# Patient Record
Sex: Male | Born: 1970 | Race: White | Hispanic: No | Marital: Single | Smoking: Never smoker
Health system: Southern US, Community
[De-identification: ages and names within clinical notes are randomized; demographics above are authoritative.]

## PROBLEM LIST (undated history)

## (undated) DIAGNOSIS — E079 Disorder of thyroid, unspecified: Secondary | ICD-10-CM

## (undated) DIAGNOSIS — I10 Essential (primary) hypertension: Secondary | ICD-10-CM

## (undated) HISTORY — PX: ANKLE FRACTURE SURGERY: SHX122

## (undated) HISTORY — PX: THYROID SURGERY: SHX805

---

## 2007-03-13 ENCOUNTER — Ambulatory Visit: Payer: Self-pay | Admitting: Emergency Medicine

## 2007-03-20 ENCOUNTER — Ambulatory Visit: Payer: Self-pay | Admitting: Internal Medicine

## 2007-06-20 ENCOUNTER — Ambulatory Visit: Payer: Self-pay | Admitting: Family Medicine

## 2007-06-23 ENCOUNTER — Ambulatory Visit: Payer: Self-pay | Admitting: Internal Medicine

## 2007-07-20 ENCOUNTER — Ambulatory Visit: Payer: Self-pay | Admitting: Internal Medicine

## 2008-10-22 ENCOUNTER — Ambulatory Visit: Payer: Self-pay | Admitting: Otolaryngology

## 2008-11-20 ENCOUNTER — Ambulatory Visit: Payer: Self-pay | Admitting: Otolaryngology

## 2009-02-20 ENCOUNTER — Ambulatory Visit: Payer: Self-pay | Admitting: Family Medicine

## 2009-12-12 ENCOUNTER — Ambulatory Visit: Payer: Self-pay | Admitting: Family Medicine

## 2011-06-08 IMAGING — CT CT HEAD WITHOUT CONTRAST
1 series · 16 of 30 positions shown, 20 images · non-contrast
Comparison: none

REASON FOR EXAM: blurred vision light headedness
COMMENTS:

PROCEDURE:     CT  - CT HEAD WITHOUT CONTRAST  - December 12, 2009  [DATE]
RESULT:     Comparison:  None
TECHNIQUE: Multiple axial images from the foramen magnum to the vertex were
obtained without IV contrast.

[Series 2: soft tissue · axial · 0.48mm/px · z∈[-170,-34]mm · 16 of 31 slices shown, 20 images]
[im 2/31  brain]
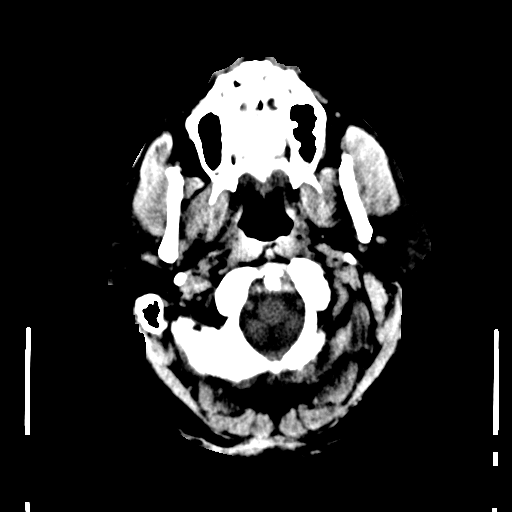
[im 2/31  bone]
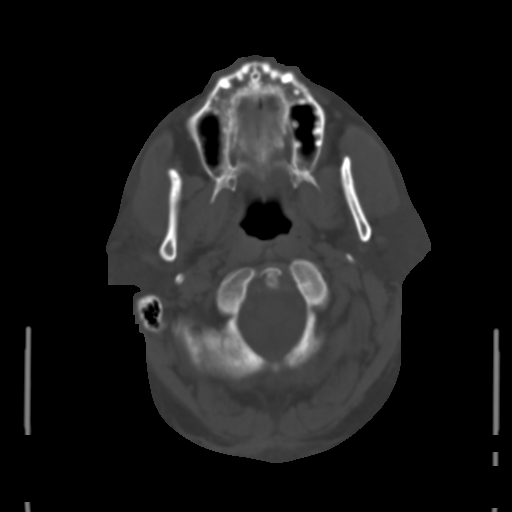
[im 4/31  brain]
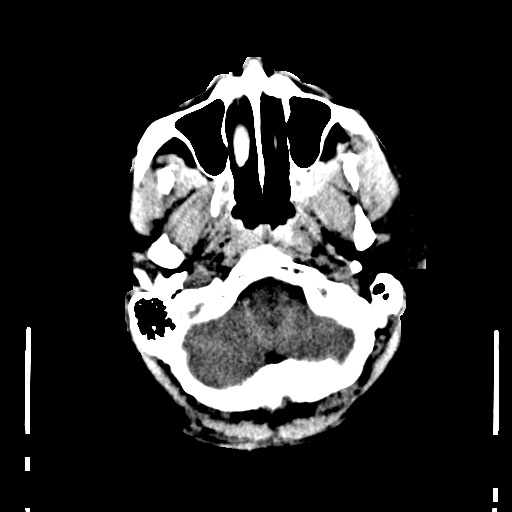
[im 6/31  brain]
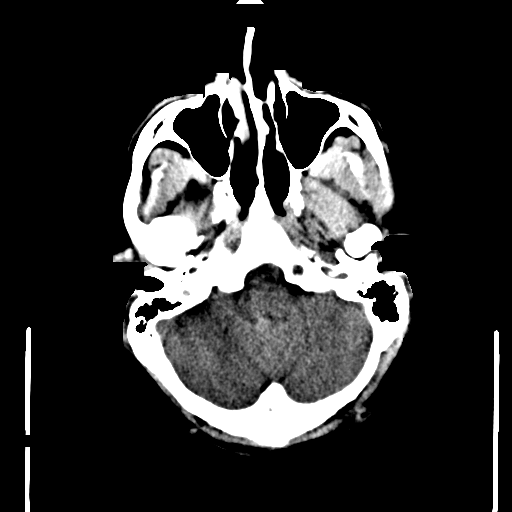
[im 8/31  brain]
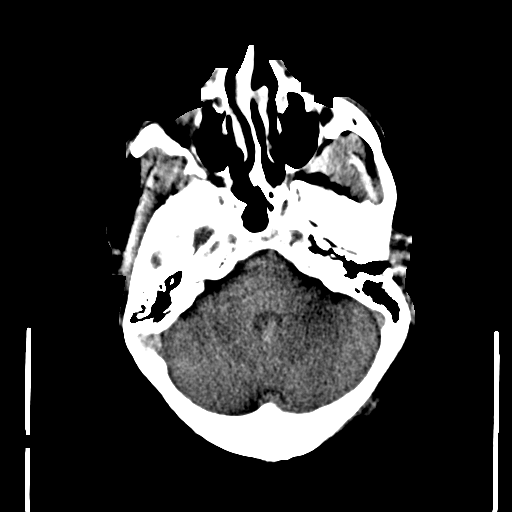
[im 9/31  brain]
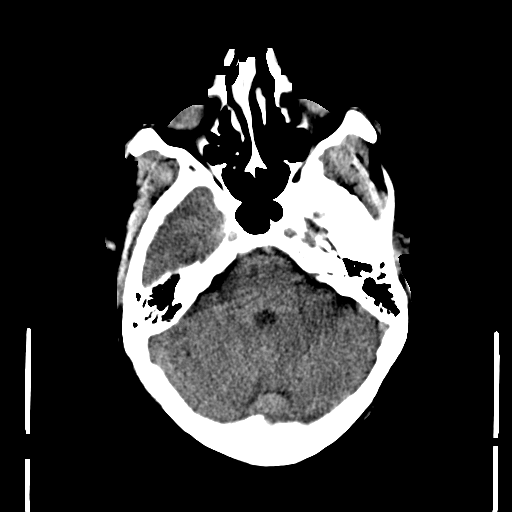
[im 9/31  bone]
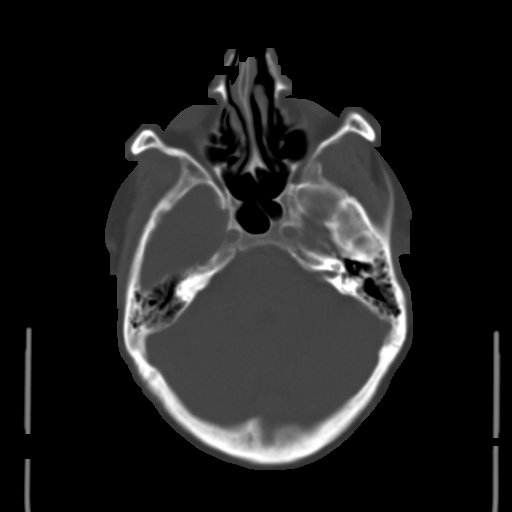
[im 11/31  brain]
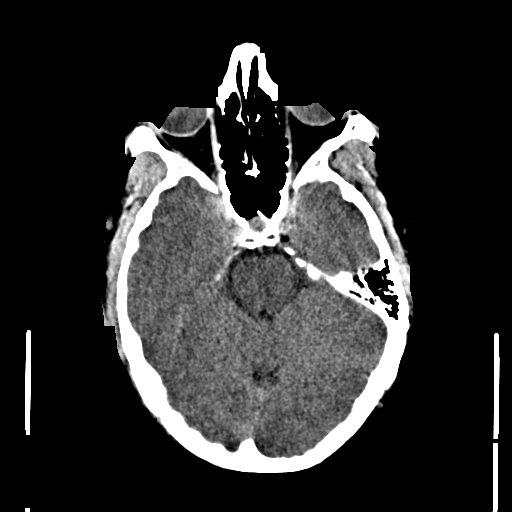
[im 13/31  brain]
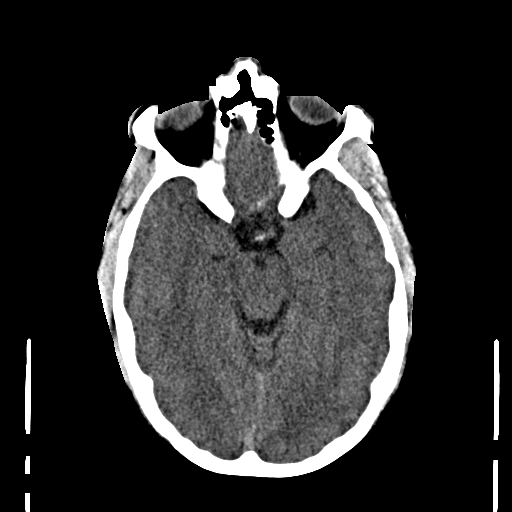
[im 15/31  brain]
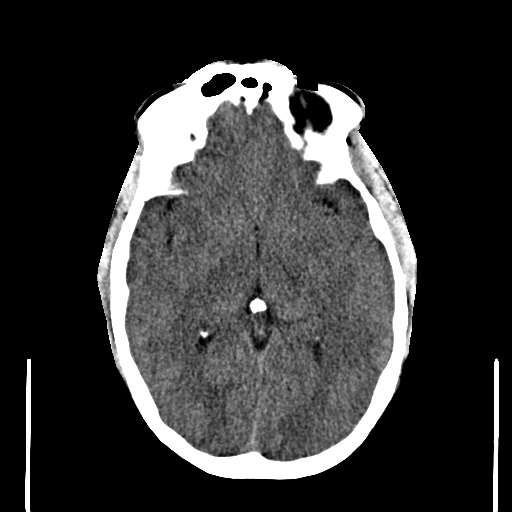
[im 16/31  brain]
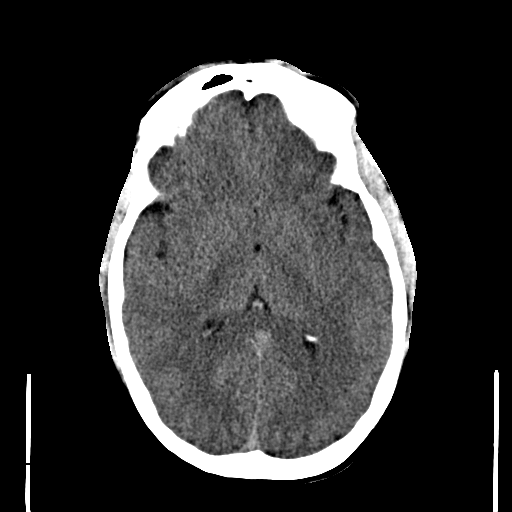
[im 16/31  bone]
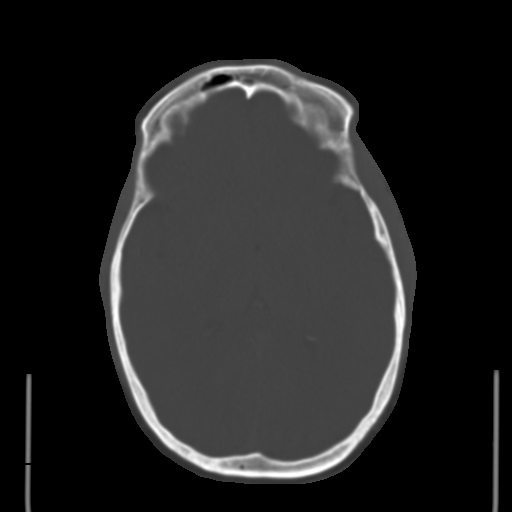
[im 18/31  brain]
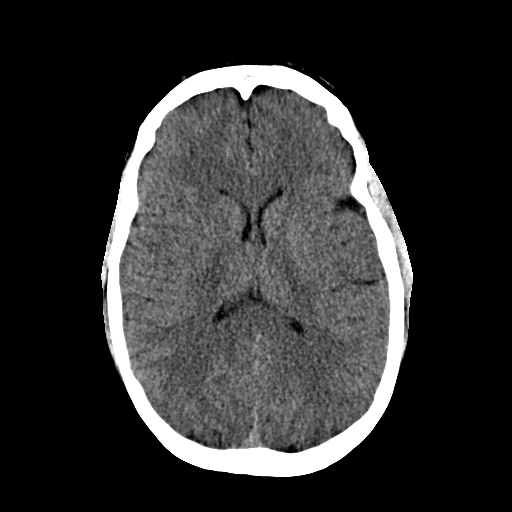
[im 20/31  brain]
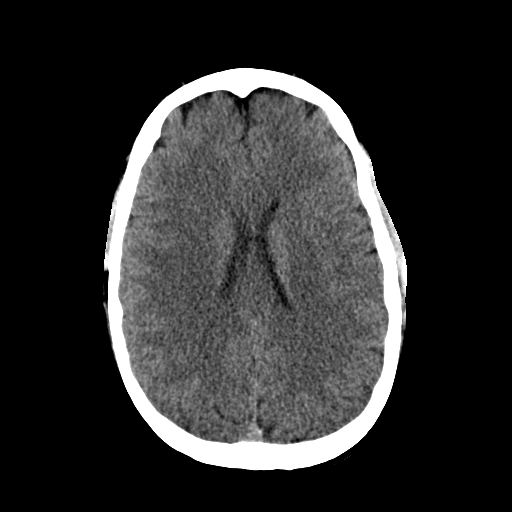
[im 22/31  brain]
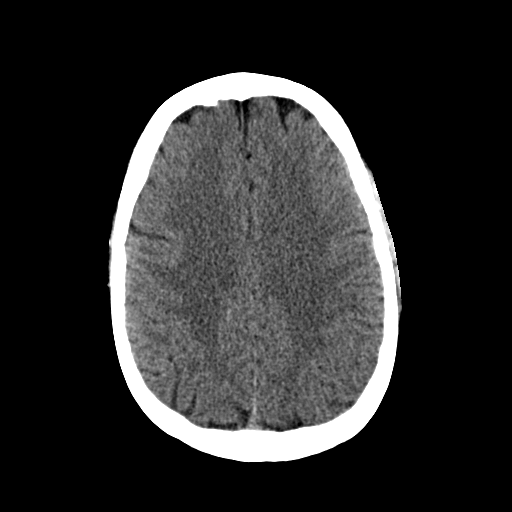
[im 23/31  brain]
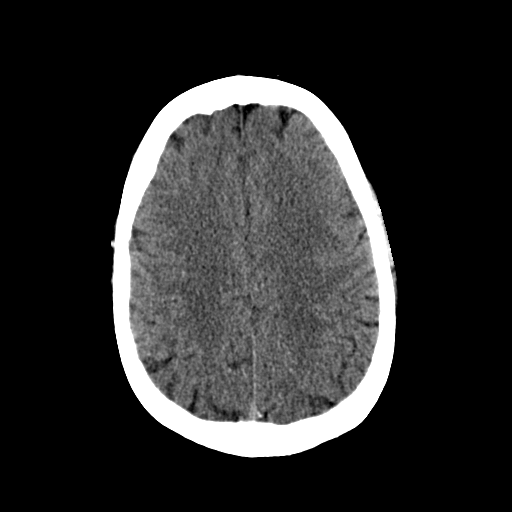
[im 23/31  bone]
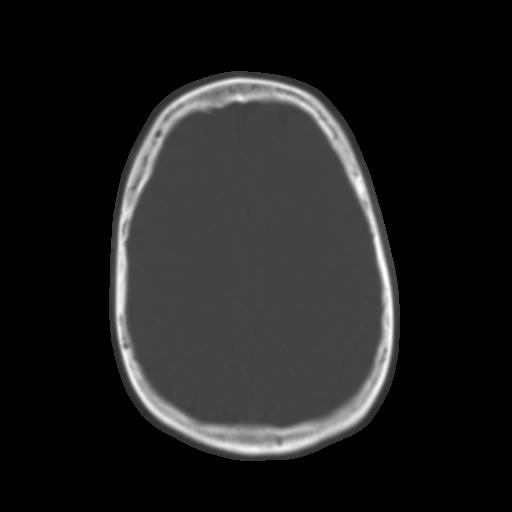
[im 25/31  brain]
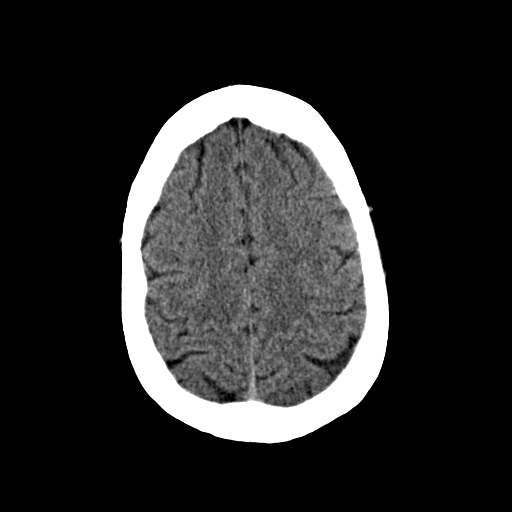
[im 27/31  brain]
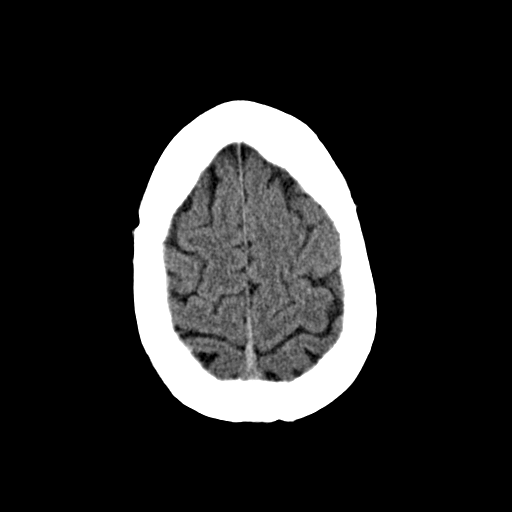
[im 29/31  brain]
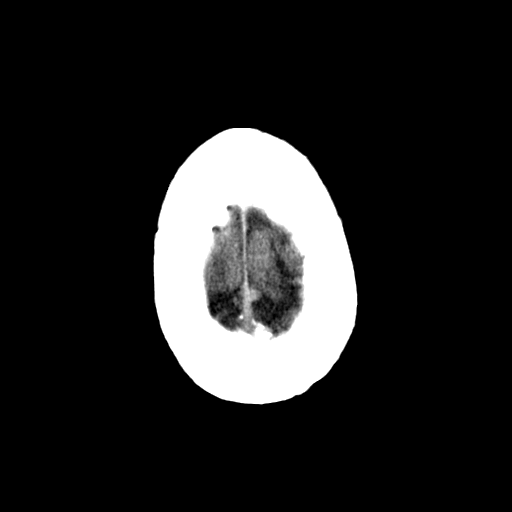

[16 of 30 positions shown; findings below may reference images not displayed]

FINDINGS: There is no evidence of mass effect, midline shift, or extra-axial fluid
collections.  There is no evidence of a space-occupying lesion or
intracranial hemorrhage. There is no evidence of a cortical-based area of
acute infarction.

The ventricles and sulci are appropriate for the patient's age. The basal
cisterns are patent.

Visualized portions of the orbits are unremarkable. The visualized portions
of the paranasal sinuses and mastoid air cells are unremarkable.

The osseous structures are unremarkable.
IMPRESSION: No acute intracranial process.

## 2013-01-26 ENCOUNTER — Ambulatory Visit: Payer: Self-pay | Admitting: Family Medicine

## 2014-04-24 ENCOUNTER — Ambulatory Visit: Payer: Self-pay | Admitting: Internal Medicine

## 2015-11-20 ENCOUNTER — Encounter: Payer: Self-pay | Admitting: *Deleted

## 2015-11-20 ENCOUNTER — Ambulatory Visit
Admission: EM | Admit: 2015-11-20 | Discharge: 2015-11-20 | Disposition: A | Discharge status: Home / Self Care | Payer: Worker's Compensation | Attending: Family Medicine | Admitting: Family Medicine

## 2015-11-20 ENCOUNTER — Ambulatory Visit (INDEPENDENT_AMBULATORY_CARE_PROVIDER_SITE_OTHER): Payer: Worker's Compensation

## 2015-11-20 DIAGNOSIS — S5402XA Injury of ulnar nerve at forearm level, left arm, initial encounter: Secondary | ICD-10-CM

## 2015-11-20 DIAGNOSIS — S46012A Strain of muscle(s) and tendon(s) of the rotator cuff of left shoulder, initial encounter: Secondary | ICD-10-CM | POA: Diagnosis not present

## 2015-11-20 DIAGNOSIS — M7712 Lateral epicondylitis, left elbow: Secondary | ICD-10-CM | POA: Diagnosis not present

## 2015-11-20 HISTORY — DX: Disorder of thyroid, unspecified: E07.9

## 2015-11-20 HISTORY — DX: Essential (primary) hypertension: I10

## 2015-11-20 MED ORDER — NAPROXEN 500 MG PO TABS: 500.0000 mg | ORAL_TABLET | Freq: Two times a day (BID) | ORAL | Status: AC

## 2015-11-20 NOTE — ED Provider Notes (Signed)
CSN: [id number]     Arrival date & time 11/20/15  1455 History   First MD Initiated Contact with Patient 11/20/15 1708     Chief Complaint  Patient presents with  . Arm Injury   (Consider location/radiation/quality/duration/timing/severity/associated sxs/prior Treatment) HPI  This a 45 year old male who presents with an injury that occurred initially at work 2 weeks ago and with a reinjury today. States that he was lifting a cartridge from a machine that was stuck in the machine and felt pain in his left shoulder and elbow. Since that time any movement of his elbow in the indicates laterally below the common extensor origin or his shoulder subacromially causing pain. He is also noticed some numbness and tingling into his ring and little fingers. He has no history of previous injuries. Sates that he is right-hand dominant. Lifting heavy objects with his left arm and hand are what bothers him the most.     Past Medical History  Diagnosis Date  . Hypertension   . Thyroid disease    Past Surgical History  Procedure Laterality Date  . Thyroid surgery    . Ankle fracture surgery Right    History reviewed. No pertinent family history. Social History  Substance Use Topics  . Smoking status: Never Smoker   . Smokeless tobacco: Never Used  . Alcohol Use: Yes    Review of Systems  Constitutional: Positive for activity change. Negative for fever, chills and fatigue.  Musculoskeletal: Positive for myalgias. Negative for neck pain and neck stiffness.  All other systems reviewed and are negative.   Allergies  Review of patient's allergies indicates no known allergies.  Home Medications   Prior to Admission medications   Medication Sig Start Date End Date Taking? Authorizing Provider  losartan (COZAAR) 50 MG tablet Take 50 mg by mouth daily.   Yes Historical Provider, MD  naproxen (NAPROSYN) 500 MG tablet Take 1 tablet (500 mg total) by mouth 2 (two) times daily with a meal. 11/20/15    Lutricia FeilWilliam P Ginny Loomer, PA-C   Meds Ordered and Administered this Visit  Medications - No data to display  BP 151/90 mmHg  Pulse 91  Temp(Src) 98 F (36.7 C) (Oral)  Resp 18  Ht 6\' 1"  (1.854 m)  Wt 290 lb (131.543 kg)  BMI 38.27 kg/m2  SpO2 99% No data found.   Physical Exam  Constitutional: He is oriented to person, place, and time. He appears well-developed and well-nourished. No distress.  HENT:  Head: Normocephalic and atraumatic.  Eyes: Conjunctivae are normal. Pupils are equal, round, and reactive to light.  Neck: Normal range of motion. Neck supple.  Musculoskeletal: Normal range of motion. He exhibits tenderness. He exhibits no edema.  Examination of the left upper extremity shows good range of motion of the shoulder. Is tenderness mostly in the subacromial area which reproduces symptoms. This is slightly anterior of midline. He has a positive empty can test is able to hold against gravity but it gives way with resistance. He has a negative straight arm raise test. Examination of the left elbow shows some tenderness at the left common her extensor origin using his symptoms. This appears more of a strain than a tear. He does have lateral elbow pain with lifting against gravity and also resisted wrist extension extension. There is too positive Tinel sign of the ulnar nerve and the ulnar groove.  Neurological: He is alert and oriented to person, place, and time.  Skin: Skin is warm and dry. He is  not diaphoretic.  Psychiatric: He has a normal mood and affect. His behavior is normal. Judgment and thought content normal.  Nursing note and vitals reviewed.   ED Course  Procedures (including critical care time)  Labs Review Labs Reviewed - No data to display  Imaging Review Dg Elbow Complete Left  11/20/2015  CLINICAL DATA:  Left arm injury radiating to the elbow and shoulder. Olecranon pain. EXAM: LEFT SHOULDER - 2+ VIEW; LEFT ELBOW - COMPLETE 3+ VIEW COMPARISON:  11/20/2015  FINDINGS: There is no evidence of fracture or dislocation. There is no evidence of arthropathy or other focal bone abnormality. Soft tissues are unremarkable. IMPRESSION: No acute osseous finding. Electronically Signed   By: Judie Petit.  Shick M.D.   On: 11/20/2015 18:06   Dg Shoulder Left  11/20/2015  CLINICAL DATA:  Left arm injury radiating to the elbow and shoulder. Olecranon pain. EXAM: LEFT SHOULDER - 2+ VIEW; LEFT ELBOW - COMPLETE 3+ VIEW COMPARISON:  11/20/2015 FINDINGS: There is no evidence of fracture or dislocation. There is no evidence of arthropathy or other focal bone abnormality. Soft tissues are unremarkable. IMPRESSION: No acute osseous finding. Electronically Signed   By: Judie Petit.  Shick M.D.   On: 11/20/2015 18:06     Visual Acuity Review  Right Eye Distance:   Left Eye Distance:   Bilateral Distance:    Right Eye Near:   Left Eye Near:    Bilateral Near:         MDM   1. Rotator cuff strain, left, initial encounter   2. Lateral epicondylitis (tennis elbow), left   3. Neuropraxia of ulnar, left, initial encounter    Discharge Medication List as of 11/20/2015  6:24 PM    START taking these medications   Details  naproxen (NAPROSYN) 500 MG tablet Take 1 tablet (500 mg total) by mouth 2 (two) times daily with a meal., Starting 11/20/2015, Until Discontinued, Normal      Plan: 1. Test/x-ray results and diagnosis reviewed with patient 2. rx as per orders; risks, benefits, potential side effects reviewed with patient 3. Recommend supportive treatment with Symptom avoidance of the left upper extremity. Given him restrictions for 1 week and will follow up with Hassell Halim FNP for further evaluation and treatment next week. He was given a prescription for Naprosyn that would take twice a day with food. 4. F/u prn if symptoms worsen or don't improve     Lutricia Feil, PA-C 11/20/15 1835

## 2015-11-20 NOTE — Discharge Instructions (Signed)
Rotator Cuff Injury Rotator cuff injury is any type of injury to the set of muscles and tendons that make up the stabilizing unit of your shoulder. This unit holds the ball of your upper arm bone (humerus) in the socket of your shoulder blade (scapula).  CAUSES Injuries to your rotator cuff most commonly come from sports or activities that cause your arm to be moved repeatedly over your head. Examples of this include throwing, weight lifting, swimming, or racquet sports. Long lasting (chronic) irritation of your rotator cuff can cause soreness and swelling (inflammation), bursitis, and eventual damage to your tendons, such as a tear (rupture). SIGNS AND SYMPTOMS Acute rotator cuff tear:  Sudden tearing sensation followed by severe pain shooting from your upper shoulder down your arm toward your elbow.  Decreased range of motion of your shoulder because of pain and muscle spasm.  Severe pain.  Inability to raise your arm out to the side because of pain and loss of muscle power (large tears). Chronic rotator cuff tear:  Pain that usually is worse at night and may interfere with sleep.  Gradual weakness and decreased shoulder motion as the pain worsens.  Decreased range of motion. Rotator cuff tendinitis:  Deep ache in your shoulder and the outside upper arm over your shoulder.  Pain that comes on gradually and becomes worse when lifting your arm to the side or turning it inward. DIAGNOSIS Rotator cuff injury is diagnosed through a medical history, physical exam, and imaging exam. The medical history helps determine the type of rotator cuff injury. Your health care provider will look at your injured shoulder, feel the injured area, and ask you to move your shoulder in different positions. X-ray exams typically are done to rule out other causes of shoulder pain, such as fractures. MRI is the exam of choice for the most severe shoulder injuries because the images show muscles and tendons.    TREATMENT  Chronic tear:  Medicine for pain, such as acetaminophen or ibuprofen.  Physical therapy and range-of-motion exercises may be helpful in maintaining shoulder function and strength.  Steroid injections into your shoulder joint.  Surgical repair of the rotator cuff if the injury does not heal with noninvasive treatment. Acute tear:  Anti-inflammatory medicines such as ibuprofen and naproxen to help reduce pain and swelling.  A sling to help support your arm and rest your rotator cuff muscles. Long-term use of a sling is not advised. It may cause significant stiffening of the shoulder joint.  Surgery may be considered within a few weeks, especially in younger, active people, to return the shoulder to full function.  Indications for surgical treatment include the following:  Age younger than 60 years.  Rotator cuff tears that are complete.  Physical therapy, rest, and anti-inflammatory medicines have been used for 6-8 weeks, with no improvement.  Employment or sporting activity that requires constant shoulder use. Tendinitis:  Anti-inflammatory medicines such as ibuprofen and naproxen to help reduce pain and swelling.  A sling to help support your arm and rest your rotator cuff muscles. Long-term use of a sling is not advised. It may cause significant stiffening of the shoulder joint.  Severe tendinitis may require:  Steroid injections into your shoulder joint.  Physical therapy.  Surgery. HOME CARE INSTRUCTIONS   Apply ice to your injury:  Put ice in a plastic bag.  Place a towel between your skin and the bag.  Leave the ice on for 20 minutes, 2-3 times a day.  If you  have a shoulder immobilizer (sling and straps), wear it until told otherwise by your health care provider.  You may want to sleep on several pillows or in a recliner at night to lessen swelling and pain.  Only take over-the-counter or prescription medicines for pain, discomfort, or fever as  directed by your health care provider.  Do simple hand squeezing exercises with a soft rubber ball to decrease hand swelling. SEEK MEDICAL CARE IF:   Your shoulder pain increases, or new pain or numbness develops in your arm, hand, or fingers.  Your hand or fingers are colder than your other hand. SEEK IMMEDIATE MEDICAL CARE IF:   Your arm, hand, or fingers are numb or tingling.  Your arm, hand, or fingers are increasingly swollen and painful, or they turn white or blue. MAKE SURE YOU:  Understand these instructions.  Will watch your condition.  Will get help right away if you are not doing well or get worse.   This information is not intended to replace advice given to you by your health care provider. Make sure you discuss any questions you have with your health care provider.   Document Released: 06/04/2000 Document Revised: 06/12/2013 Document Reviewed: 01/17/2013 Elsevier Interactive Patient Education 2016 [employer].  Tennis Elbow Tennis elbow (lateral epicondylitis) is inflammation of the outer tendons of your forearm close to your elbow. Your tendons attach your muscles to your bones. The outer tendons of your forearm are used to extend your wrist, and they attach on the outside part of your elbow. Tennis elbow is often found in people who play tennis, but anyone may get the condition from repeatedly extending the wrist or turning the forearm. CAUSES This condition is caused by repeatedly extending your wrist and using your hands. It can result from sports or work that requires repetitive forearm movements. Tennis elbow may also be caused by an injury. RISK FACTORS You have a higher risk of developing tennis elbow if you play tennis or another racquet sport. You also have a higher risk if you frequently use your hands for work. This condition is also more likely to develop in:  Musicians.  Carpenters, painters, and plumbers.  Cooks.  Cashiers.  People who work in  [employer].  Holiday representative workers.  Butchers.  People who use computers. SYMPTOMS Symptoms of this condition include:  Pain and tenderness in your forearm and the outer part of your elbow. You may only feel the pain when you use your arm, or you may feel it even when you are not using your arm.  A burning feeling that runs from your elbow through your arm.  Weak grip in your hands. DIAGNOSIS  This condition may be diagnosed by medical history and physical exam. You may also have other tests, including:  X-rays.  MRI. TREATMENT Your health care provider will recommend lifestyle adjustments, such as resting and icing your arm. Treatment may also include:  Medicines for inflammation. This may include shots of cortisone if your pain continues.  Physical therapy. This may include massage or exercises.  An elbow brace. Surgery may eventually be recommended if your pain does not go away with treatment. HOME CARE INSTRUCTIONS Activity  Rest your elbow and wrist as directed by your health care provider. Try to avoid any activities that caused the problem until your health care provider says that you can do them again.  If a physical therapist teaches you exercises, do all of them as directed.  If you lift an object, lift  it with your palm facing upward. This lowers the stress on your elbow. Lifestyle  If your tennis elbow is caused by sports, check your equipment and make sure that:  You are using it correctly.  It is the best fit for you.  If your tennis elbow is caused by work, take breaks frequently, if you are able. Talk with your manager about how to best perform tasks in a way that is safe.  If your tennis elbow is caused by computer use, talk with your manager about any changes that can be made to your work environment. General Instructions  If directed, apply ice to the painful area:  Put ice in a plastic bag.  Place a towel between your skin and the bag.  Leave  the ice on for 20 minutes, 2-3 times per day.  Take medicines only as directed by your health care provider.  If you were given a brace, wear it as directed by your health care provider.  Keep all follow-up visits as directed by your health care provider. This is important. SEEK MEDICAL CARE IF:  Your pain does not get better with treatment.  Your pain gets worse.  You have numbness or weakness in your forearm, hand, or fingers.   This information is not intended to replace advice given to you by your health care provider. Make sure you discuss any questions you have with your health care provider.   Document Released: 06/07/2005 Document Revised: 10/22/2014 Document Reviewed: 06/03/2014 Elsevier Interactive Patient Education [employer]2016 Elsevier Inc.

## 2015-11-20 NOTE — ED Notes (Signed)
Patient injured left forearm while at work today when lifting a piece of equipment. The pain radiates up from his left forearm to the back of his upper arm and left shoulder. No history of previous arm injuries.

## 2017-05-16 IMAGING — CR DG SHOULDER 2+V*L*
4 series · 4 of 4 positions shown · non-contrast
Comparison: 11/20/2015

CLINICAL DATA: Left arm injury radiating to the elbow and shoulder.
Olecranon pain.

EXAM:
LEFT SHOULDER - 2+ VIEW; LEFT ELBOW - COMPLETE 3+ VIEW

[shoulder grashey (1 of 3)]
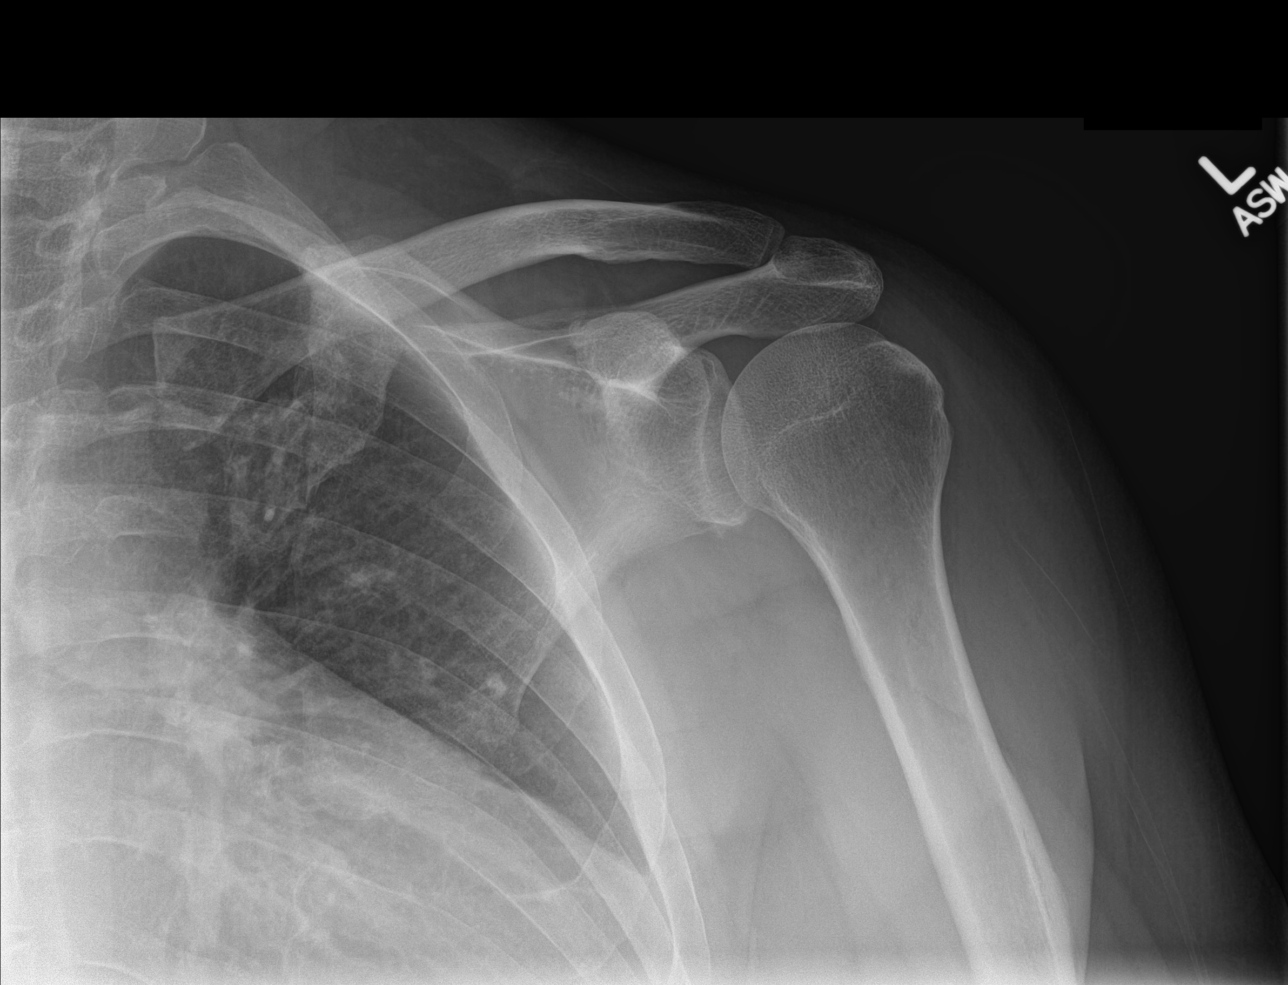

[shoulder y view]
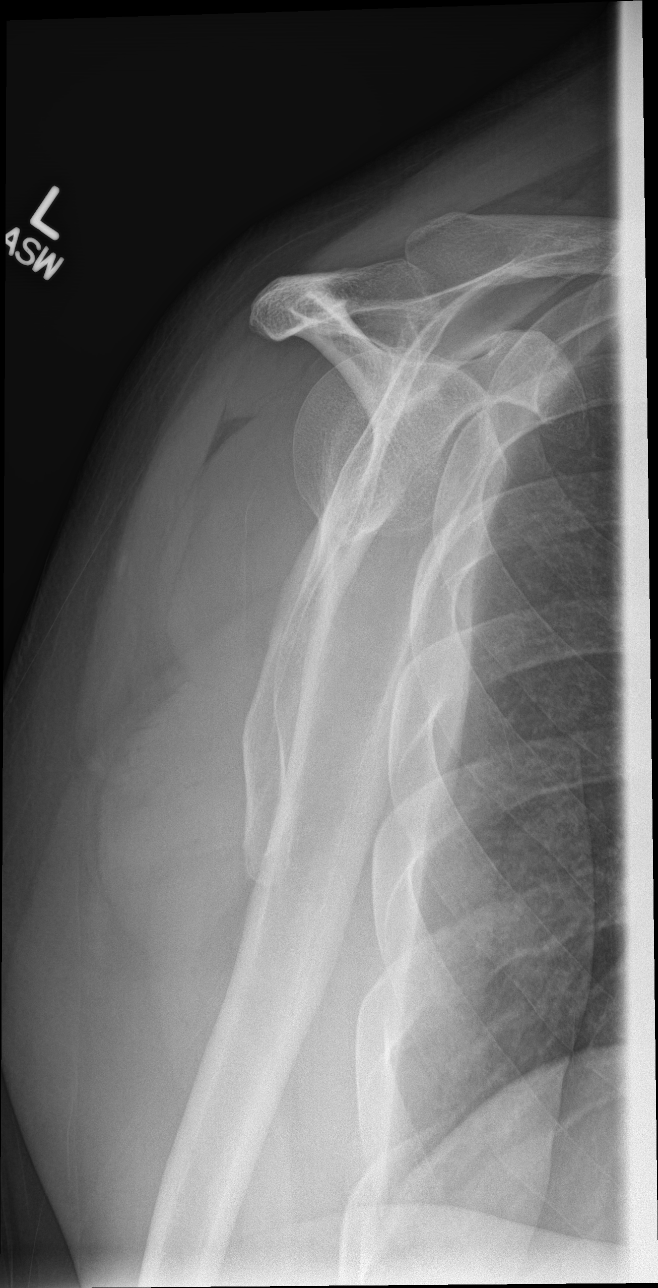

[shoulder grashey (2 of 3)]
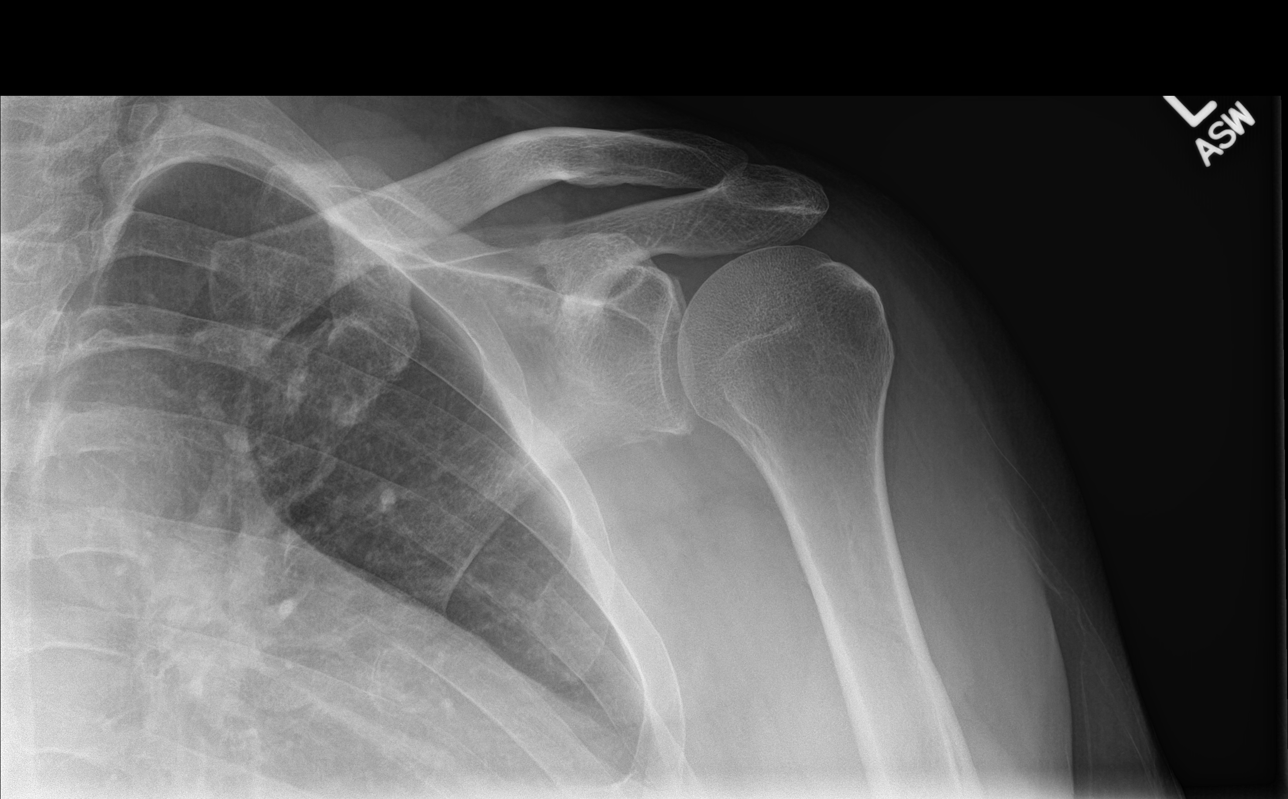

[shoulder grashey (3 of 3)]
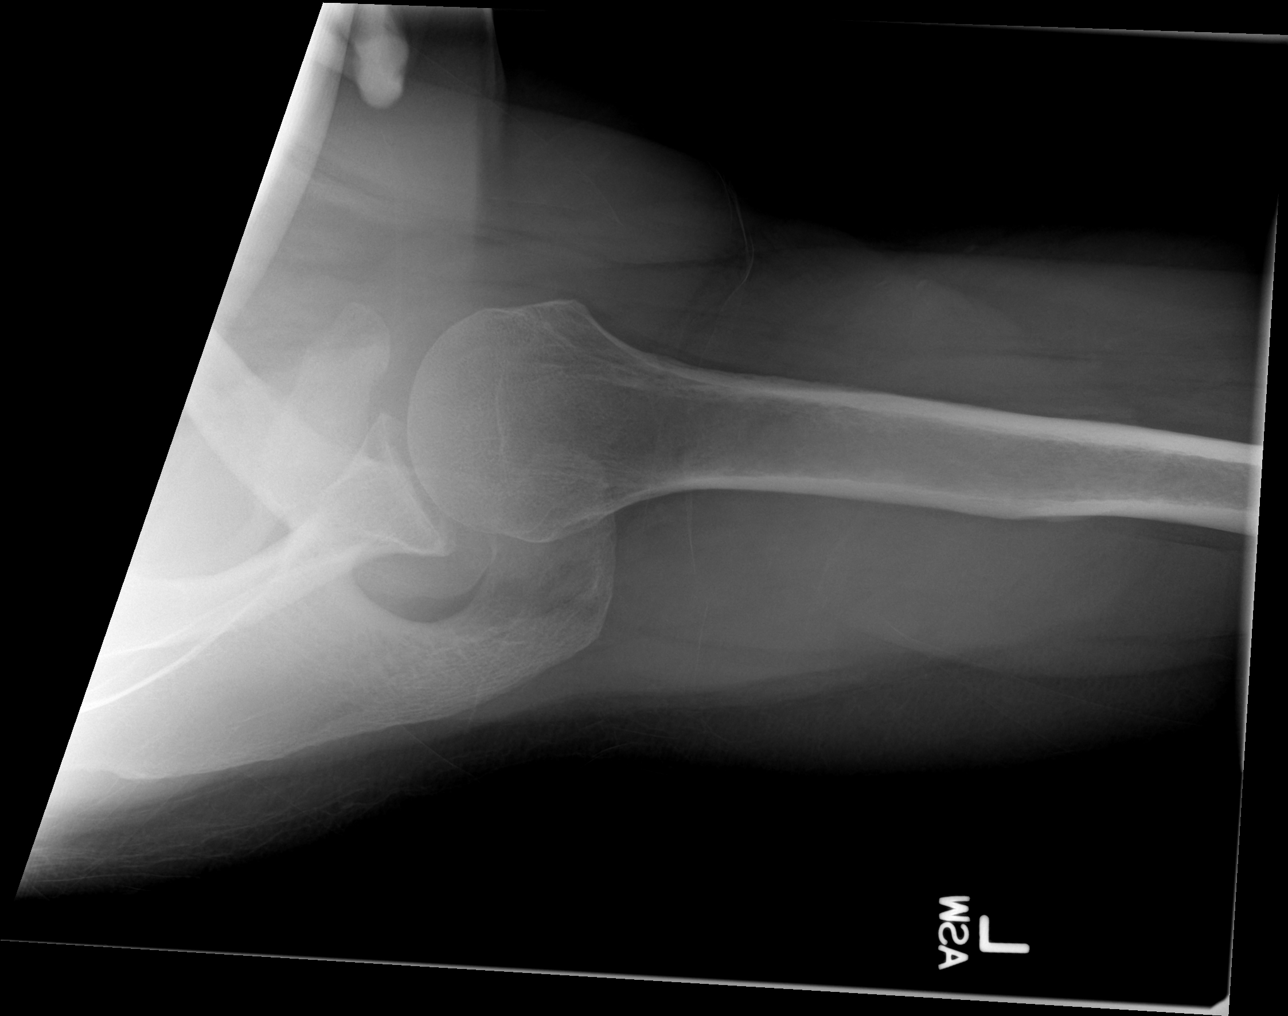

[4 of 4 positions shown; findings below may reference images not displayed]

FINDINGS: There is no evidence of fracture or dislocation. There is no
evidence of arthropathy or other focal bone abnormality. Soft
tissues are unremarkable.
IMPRESSION: No acute osseous finding.

## 2019-12-28 ENCOUNTER — Ambulatory Visit: Payer: Self-pay | Attending: Internal Medicine

## 2019-12-28 DIAGNOSIS — Z23 Encounter for immunization: Secondary | ICD-10-CM

## 2019-12-28 NOTE — Progress Notes (Signed)
   Covid-19 Vaccination Clinic  Name:  JADIN CREQUE    MRN: [id number] DOB: 02-21-1971  12/28/2019  Mr. Kwiatek was observed post Covid-19 immunization for 15 minutes without incident. He was provided with Vaccine Information Sheet and instruction to access the V-Safe system.   Mr. Scurlock was instructed to call 911 with any severe reactions post vaccine: Marland Kitchen Difficulty breathing  . Swelling of face and throat  . A fast heartbeat  . A bad rash all over body  . Dizziness and weakness   Immunizations Administered    Name Date Dose VIS Date Route   Pfizer COVID-19 Vaccine 12/28/2019 10:00 AM 0.3 mL 08/15/2018 Intramuscular   Manufacturer: [employer], [employer]   Lot: [id number]   NDC: [address]-2

## 2020-01-18 ENCOUNTER — Ambulatory Visit: Payer: Self-pay | Attending: Internal Medicine

## 2020-01-18 DIAGNOSIS — Z23 Encounter for immunization: Secondary | ICD-10-CM

## 2020-01-18 NOTE — Progress Notes (Signed)
   Covid-19 Vaccination Clinic  Name:  RAYON MCCHRISTIAN    MRN: [id number] DOB: 1970-12-04  01/18/2020  Mr. Patchell was observed post Covid-19 immunization for 15 minutes without incident. He was provided with Vaccine Information Sheet and instruction to access the V-Safe system.   Mr. Knight was instructed to call 911 with any severe reactions post vaccine: Marland Kitchen Difficulty breathing  . Swelling of face and throat  . A fast heartbeat  . A bad rash all over body  . Dizziness and weakness   Immunizations Administered    Name Date Dose VIS Date Route   Pfizer COVID-19 Vaccine 01/18/2020 10:00 AM 0.3 mL 08/15/2018 Intramuscular   Manufacturer: [employer], [employer]   Lot: [id number]   NDC: [address]-3

## 2020-10-10 ENCOUNTER — Other Ambulatory Visit: Payer: Self-pay | Admitting: Physician Assistant

## 2022-09-09 ENCOUNTER — Encounter: Payer: Self-pay | Admitting: Gastroenterology

## 2022-09-09 NOTE — H&P (Signed)
Pre-Procedure H&P   Patient ID: Travis Bolton is a 52 y.o. male.  Gastroenterology Provider: Annamaria Helling, DO  Referring Provider: Dawson Bills, NP PCP: Sofie Hartigan, MD  Date: 09/10/2022  HPI [name] is a 52 y.o. male who presents today for Colonoscopy for colorectal cancer screening .  Patient reports regular bowel movements without melena hematochezia diarrhea or constipation.  He did have 1 episode of bright red blood per rectum last year.  No family history of colon cancer or colon polyps  Most recent lab work hemoglobin 14.2 MCV 86 platelets 234,000 creatinine 0.8  No previous upper or lower endoscopy   Past Medical History:  Diagnosis Date   Hypertension    Thyroid disease     Past Surgical History:  Procedure Laterality Date   ANKLE FRACTURE SURGERY Right    THYROID SURGERY      Family History Mother-esophageal cancer, lung cancer-deceased No h/o GI disease or malignancy  Review of Systems  Constitutional:  Negative for activity change, appetite change, chills, diaphoresis, fatigue, fever and unexpected weight change.  HENT:  Negative for trouble swallowing and voice change.   Respiratory:  Negative for shortness of breath and wheezing.   Cardiovascular:  Negative for chest pain, palpitations and leg swelling.  Gastrointestinal:  Negative for abdominal distention, abdominal pain, anal bleeding, blood in stool, constipation, diarrhea, nausea and vomiting.  Musculoskeletal:  Negative for arthralgias and myalgias.  Skin:  Negative for color change and pallor.  Neurological:  Negative for dizziness, syncope and weakness.  Psychiatric/Behavioral:  Negative for confusion. The patient is not nervous/anxious.   All other systems reviewed and are negative.    Medications No current facility-administered medications on file prior to encounter.   Current Outpatient Medications on File Prior to Encounter  Medication Sig Dispense Refill    losartan (COZAAR) 50 MG tablet Take 50 mg by mouth daily.     naproxen (NAPROSYN) 500 MG tablet Take 1 tablet (500 mg total) by mouth 2 (two) times daily with a meal. (Patient not taking: Reported on 09/10/2022) 60 tablet 0    Pertinent medications related to GI and procedure were reviewed by me with the patient prior to the procedure   Current Facility-Administered Medications:    0.9 %  sodium chloride infusion, , Intravenous, Continuous, Annamaria Helling, DO, Last Rate: 20 mL/hr at 09/10/22 1012, 1,000 mL at 09/10/22 1012      No Known Allergies Allergies were reviewed by me prior to the procedure  Objective   Body mass index is 38.95 kg/m. Vitals:   09/10/22 0956  BP: (!) 154/113  Pulse: 90  Resp: 20  Temp: (!) 97.3 F (36.3 C)  TempSrc: Temporal  SpO2: 98%  Weight: 130.3 kg  Height: 6' (1.829 m)     Physical Exam Vitals and nursing note reviewed.  Constitutional:      General: He is not in acute distress.    Appearance: Normal appearance. He is not ill-appearing, toxic-appearing or diaphoretic.  HENT:     Head: Normocephalic and atraumatic.     Nose: Nose normal.     Mouth/Throat:     Mouth: Mucous membranes are moist.     Pharynx: Oropharynx is clear.  Eyes:     General: No scleral icterus.    Extraocular Movements: Extraocular movements intact.  Cardiovascular:     Rate and Rhythm: Normal rate and regular rhythm.     Heart sounds: Normal heart sounds. No murmur  heard.    No friction rub. No gallop.  Pulmonary:     Effort: Pulmonary effort is normal. No respiratory distress.     Breath sounds: Normal breath sounds. No wheezing, rhonchi or rales.  Abdominal:     General: Bowel sounds are normal. There is no distension.     Palpations: Abdomen is soft.     Tenderness: There is no abdominal tenderness. There is no guarding or rebound.  Musculoskeletal:     Cervical back: Neck supple.     Right lower leg: No edema.     Left lower leg: No edema.   Skin:    General: Skin is warm and dry.     Coloration: Skin is not jaundiced or pale.  Neurological:     General: No focal deficit present.     Mental Status: He is alert and oriented to person, place, and time. Mental status is at baseline.  Psychiatric:        Mood and Affect: Mood normal.        Behavior: Behavior normal.        Thought Content: Thought content normal.        Judgment: Judgment normal.      Assessment:  [name] is a 52 y.o. male  who presents today for Colonoscopy for colorectal cancer screening .  Plan:  Colonoscopy with possible intervention today  Colonoscopy with possible biopsy, control of bleeding, polypectomy, and interventions as necessary has been discussed with the patient/patient representative. Informed consent was obtained from the patient/patient representative after explaining the indication, nature, and risks of the procedure including but not limited to death, bleeding, perforation, missed neoplasm/lesions, cardiorespiratory compromise, and reaction to medications. Opportunity for questions was given and appropriate answers were provided. Patient/patient representative has verbalized understanding is amenable to undergoing the procedure.   Annamaria Helling, DO  [facility]  Portions of the record may have been created with voice recognition software. Occasional wrong-word or 'sound-a-like' substitutions may have occurred due to the inherent limitations of voice recognition software.  Read the chart carefully and recognize, using context, where substitutions may have occurred.

## 2022-09-10 ENCOUNTER — Encounter
Admission: RE | Disposition: A | Discharge status: Home / Self Care | Payer: Self-pay | Source: Home / Self Care | Attending: Gastroenterology

## 2022-09-10 ENCOUNTER — Ambulatory Visit: Payer: BC Managed Care – PPO | Admitting: Anesthesiology

## 2022-09-10 ENCOUNTER — Ambulatory Visit
Admission: RE | Admit: 2022-09-10 | Discharge: 2022-09-10 | Disposition: A | Discharge status: Home / Self Care | Payer: BC Managed Care – PPO | Attending: Gastroenterology | Admitting: Gastroenterology

## 2022-09-10 ENCOUNTER — Encounter: Payer: Self-pay | Admitting: Gastroenterology

## 2022-09-10 DIAGNOSIS — I10 Essential (primary) hypertension: Secondary | ICD-10-CM | POA: Diagnosis not present

## 2022-09-10 DIAGNOSIS — Z79899 Other long term (current) drug therapy: Secondary | ICD-10-CM | POA: Insufficient documentation

## 2022-09-10 DIAGNOSIS — K640 First degree hemorrhoids: Secondary | ICD-10-CM | POA: Diagnosis not present

## 2022-09-10 DIAGNOSIS — Z1211 Encounter for screening for malignant neoplasm of colon: Secondary | ICD-10-CM | POA: Insufficient documentation

## 2022-09-10 DIAGNOSIS — D123 Benign neoplasm of transverse colon: Secondary | ICD-10-CM | POA: Diagnosis not present

## 2022-09-10 DIAGNOSIS — K589 Irritable bowel syndrome without diarrhea: Secondary | ICD-10-CM | POA: Insufficient documentation

## 2022-09-10 DIAGNOSIS — D128 Benign neoplasm of rectum: Secondary | ICD-10-CM | POA: Insufficient documentation

## 2022-09-10 HISTORY — PX: COLONOSCOPY: SHX5424

## 2022-09-10 SURGERY — COLONOSCOPY
Anesthesia: General

## 2022-09-10 MED ORDER — PROPOFOL 500 MG/50ML IV EMUL
INTRAVENOUS | Status: DC | PRN
  Administered 2022-09-10: 125 ug/kg/min via INTRAVENOUS

## 2022-09-10 MED ORDER — SODIUM CHLORIDE 0.9 % IV SOLN
INTRAVENOUS | Status: DC
  Administered 2022-09-10: 1000 mL via INTRAVENOUS

## 2022-09-10 MED ORDER — PROPOFOL 10 MG/ML IV BOLUS
INTRAVENOUS | Status: DC | PRN
  Administered 2022-09-10: 50 mg via INTRAVENOUS

## 2022-09-10 MED ORDER — PROPOFOL 1000 MG/100ML IV EMUL
INTRAVENOUS | Status: AC
  Filled 2022-09-10: qty 100

## 2022-09-10 NOTE — Interval H&P Note (Signed)
History and Physical Interval Note: Preprocedure H&P from 09/10/22  was reviewed and there was no interval change after seeing and examining the patient.  Written consent was obtained from the patient after discussion of risks, benefits, and alternatives. Patient has consented to proceed with Colonoscopy with possible intervention   09/10/2022 10:36 AM  Travis Bolton  has presented today for surgery, with the diagnosis of Screen for colon cancer (Z12.11).  The various methods of treatment have been discussed with the patient and family. After consideration of risks, benefits and other options for treatment, the patient has consented to  Procedure(s): COLONOSCOPY (N/A) as a surgical intervention.  The patient's history has been reviewed, patient examined, no change in status, stable for surgery.  I have reviewed the patient's chart and labs.  Questions were answered to the patient's satisfaction.     Travis Bolton

## 2022-09-10 NOTE — Op Note (Signed)
[facility] Patient Name: Travis Bolton Procedure Date: 09/10/2022 10:39 AM MRN: [id number] Account #: [id number] Date of Birth: October 18, 1970 Admit Type: Outpatient Age: 52 Room: [facility] ENDO ROOM 1 Gender: Male Note Status: Finalized Instrument Name: Colonoscope [id number] Procedure:             Colonoscopy Indications:           Screening for colorectal malignant neoplasm Providers:             Annamaria Helling DO, DO Referring MD:          Sofie Hartigan (Referring MD) Medicines:             Monitored Anesthesia Care Complications:         No immediate complications. Estimated blood loss:                         Minimal. Procedure:             Pre-Anesthesia Assessment:                        - Prior to the procedure, a History and Physical was                         performed, and patient medications and allergies were                         reviewed. The patient is competent. The risks and                         benefits of the procedure and the sedation options and                         risks were discussed with the patient. All questions                         were answered and informed consent was obtained.                         Patient identification and proposed procedure were                         verified by the physician, the nurse, the anesthetist                         and the technician in the endoscopy suite. Mental                         Status Examination: alert and oriented. Airway                         Examination: normal oropharyngeal airway and neck                         mobility. Respiratory Examination: clear to                         auscultation. CV Examination: RRR, no murmurs, no S3  or S4. Prophylactic Antibiotics: The patient does not                         require prophylactic antibiotics. Prior                         Anticoagulants: The patient has taken no anticoagulant                          or antiplatelet agents. ASA Grade Assessment: III - A                         patient with severe systemic disease. After reviewing                         the risks and benefits, the patient was deemed in                         satisfactory condition to undergo the procedure. The                         anesthesia plan was to use monitored anesthesia care                         (MAC). Immediately prior to administration of                         medications, the patient was re-assessed for adequacy                         to receive sedatives. The heart rate, respiratory                         rate, oxygen saturations, blood pressure, adequacy of                         pulmonary ventilation, and response to care were                         monitored throughout the procedure. The physical                         status of the patient was re-assessed after the                         procedure.                        After obtaining informed consent, the colonoscope was                         passed under direct vision. Throughout the procedure,                         the patient's blood pressure, pulse, and oxygen                         saturations were monitored continuously. The  Colonoscope was introduced through the anus and                         advanced to the the cecum, identified by appendiceal                         orifice and ileocecal valve. The colonoscopy was                         somewhat difficult due to patient frequently kicking.                         Successful completion of the procedure was aided by                         receiving assistance from additional staff. The                         patient tolerated the procedure well. The quality of                         the bowel preparation was evaluated using the BBPS                         [facility] Bowel Preparation Scale) with scores of: Right                          Colon = 3, Transverse Colon = 3 and Left Colon = 3                         (entire mucosa seen well with no residual staining,                         small fragments of stool or opaque liquid). The total                         BBPS score equals 9. The terminal ileum, ileocecal                         valve, appendiceal orifice, and rectum were                         photographed. Findings:      The perianal and digital rectal examinations were normal. Pertinent       negatives include normal sphincter tone.      The terminal ileum appeared normal. Estimated blood loss: none.      Two sessile polyps were found in the rectum and transverse colon. The       polyps were 1 to 2 mm in size. These polyps were removed with a jumbo       cold forceps. Resection and retrieval were complete. Estimated blood       loss was minimal.      Non-bleeding internal hemorrhoids were found during retroflexion. The       hemorrhoids were Grade I (internal hemorrhoids that do not prolapse).       Estimated blood loss: none.      There was moderate spasm in the  recto-sigmoid colon and in the sigmoid       colon. Estimated blood loss: none.      The exam was otherwise without abnormality on direct and retroflexion       views. Impression:            - The examined portion of the ileum was normal.                        - Two 1 to 2 mm polyps in the rectum and in the                         transverse colon, removed with a jumbo cold forceps.                         Resected and retrieved.                        - Non-bleeding internal hemorrhoids.                        - Moderate colonic spasm.                        - The examination was otherwise normal on direct and                         retroflexion views. Recommendation:        - Patient has a contact number available for                         emergencies. The signs and symptoms of potential                         delayed complications  were discussed with the patient.                         Return to normal activities tomorrow. Written                         discharge instructions were provided to the patient.                        - Discharge patient to home.                        - Resume previous diet.                        - Continue present medications.                        - Await pathology results.                        - Repeat colonoscopy for surveillance based on                         pathology results.                        - Return to referring physician  as previously                         scheduled.                        - The findings and recommendations were discussed with                         the patient. Procedure Code(s):     --- Professional ---                        [phone], Colonoscopy, flexible; with biopsy, single or                         multiple Diagnosis Code(s):     --- Professional ---                        Z12.11, Encounter for screening for malignant neoplasm                         of colon                        K64.0, First degree hemorrhoids                        K58.9, Irritable bowel syndrome without diarrhea                        D12.8, Benign neoplasm of rectum                        D12.3, Benign neoplasm of transverse colon (hepatic                         flexure or splenic flexure) CPT copyright 2022 American Medical Association. All rights reserved. The codes documented in this report are preliminary and upon coder review may  be revised to meet current compliance requirements. Attending Participation:      I personally performed the entire procedure. Volney American, DO Annamaria Helling DO, DO 09/10/2022 11:12:40 AM This report has been signed electronically. Number of Addenda: 0 Note Initiated On: 09/10/2022 10:39 AM Scope Withdrawal Time: 0 hours 10 minutes 8 seconds  Total Procedure Duration: 0 hours 12 minutes 37 seconds  Estimated Blood Loss:   Estimated blood loss was minimal.      [facility]

## 2022-09-10 NOTE — Transfer of Care (Signed)
Immediate Anesthesia Transfer of Care Note  Patient: Travis Bolton  Procedure(s) Performed: COLONOSCOPY  Patient Location: PACU  Anesthesia Type:General  Level of Consciousness: awake  Airway & Oxygen Therapy: Patient Spontanous Breathing  Post-op Assessment: Report given to RN and Post -op Vital signs reviewed and stable  Post vital signs: Reviewed and stable  Last Vitals:  Vitals Value Taken Time  BP 97/53 09/10/22 1112  Temp    Pulse 86 09/10/22 1112  Resp 21 09/10/22 1112  SpO2 94 % 09/10/22 1112    Last Pain:  Vitals:   09/10/22 1112  TempSrc:   PainSc: Asleep         Complications: No notable events documented.

## 2022-09-10 NOTE — Anesthesia Postprocedure Evaluation (Signed)
Anesthesia Post Note  Patient: Travis Bolton  Procedure(s) Performed: COLONOSCOPY  Patient location during evaluation: PACU Anesthesia Type: General Level of consciousness: awake and oriented Pain management: pain level controlled Vital Signs Assessment: post-procedure vital signs reviewed and stable Respiratory status: spontaneous breathing Cardiovascular status: blood pressure returned to baseline Anesthetic complications: no   No notable events documented.   Last Vitals:  Vitals:   09/10/22 0956 09/10/22 1112  BP: (!) 154/113 (!) 97/53  Pulse: 90 86  Resp: 20 (!) 21  Temp: (!) 36.3 C   SpO2: 98% 94%    Last Pain:  Vitals:   09/10/22 1112  TempSrc:   PainSc: Asleep                 VAN STAVEREN,Leshea Jaggers

## 2022-09-10 NOTE — Anesthesia Preprocedure Evaluation (Signed)
Anesthesia Evaluation  Patient identified by MRN, date of birth, ID band Patient awake    Reviewed: Allergy & Precautions, NPO status , Patient's Chart, lab work & pertinent test results  Airway Mallampati: II  TM Distance: >3 FB Neck ROM: Full    Dental  (+) Teeth Intact   Pulmonary neg pulmonary ROS   Pulmonary exam normal  + decreased breath sounds      Cardiovascular Exercise Tolerance: Good hypertension, Pt. on medications negative cardio ROS Normal cardiovascular exam Rhythm:Regular Rate:Normal     Neuro/Psych negative neurological ROS  negative psych ROS   GI/Hepatic negative GI ROS, Neg liver ROS,,,  Endo/Other  negative endocrine ROS  Morbid obesity  Renal/GU negative Renal ROS  negative genitourinary   Musculoskeletal   Abdominal  (+) + obese  Peds negative pediatric ROS (+)  Hematology negative hematology ROS (+)   Anesthesia Other Findings Past Medical History: No date: Hypertension No date: Thyroid disease  Past Surgical History: No date: ANKLE FRACTURE SURGERY; Right No date: THYROID SURGERY  BMI    Body Mass Index: 38.95 kg/m      Reproductive/Obstetrics negative OB ROS                             Anesthesia Physical Anesthesia Plan  ASA: 3  Anesthesia Plan: General   Post-op Pain Management:    Induction: Intravenous  PONV Risk Score and Plan: Propofol infusion and TIVA  Airway Management Planned: Natural Airway  Additional Equipment:   Intra-op Plan:   Post-operative Plan:   Informed Consent: I have reviewed the patients History and Physical, chart, labs and discussed the procedure including the risks, benefits and alternatives for the proposed anesthesia with the patient or authorized representative who has indicated his/her understanding and acceptance.     Dental Advisory Given  Plan Discussed with: CRNA and Surgeon  Anesthesia Plan  Comments:        Anesthesia Quick Evaluation

## 2022-09-13 ENCOUNTER — Encounter: Payer: Self-pay | Admitting: Gastroenterology

## 2022-09-13 LAB — SURGICAL PATHOLOGY

## 2023-03-21 ENCOUNTER — Other Ambulatory Visit: Payer: Self-pay

## 2023-03-21 ENCOUNTER — Encounter: Payer: Self-pay | Admitting: Emergency Medicine

## 2023-03-21 ENCOUNTER — Emergency Department
Admission: EM | Admit: 2023-03-21 | Discharge: 2023-03-21 | Disposition: A | Discharge status: Home / Self Care | Payer: BC Managed Care – PPO | Attending: Emergency Medicine | Admitting: Emergency Medicine

## 2023-03-21 ENCOUNTER — Emergency Department: Payer: BC Managed Care – PPO

## 2023-03-21 DIAGNOSIS — I10 Essential (primary) hypertension: Secondary | ICD-10-CM | POA: Diagnosis not present

## 2023-03-21 DIAGNOSIS — Z79899 Other long term (current) drug therapy: Secondary | ICD-10-CM | POA: Diagnosis not present

## 2023-03-21 DIAGNOSIS — R0789 Other chest pain: Secondary | ICD-10-CM | POA: Diagnosis present

## 2023-03-21 LAB — BASIC METABOLIC PANEL
Anion gap: 9 (ref 5–15)
BUN: 14 mg/dL (ref 6–20)
CO2: 24 mmol/L (ref 22–32)
Calcium: 9.4 mg/dL (ref 8.9–10.3)
Chloride: 105 mmol/L (ref 98–111)
Creatinine, Ser: 0.79 mg/dL (ref 0.61–1.24)
GFR, Estimated: >60 mL/min (ref >60)
Glucose, Bld: 119 mg/dL — ABNORMAL HIGH (ref 70–99)
Potassium: 3.9 mmol/L (ref 3.5–5.1)
Sodium: 138 mmol/L (ref 135–145)

## 2023-03-21 LAB — CBC
HCT: 43.8 % (ref 39.0–52.0)
Hemoglobin: 14.6 g/dL (ref 13.0–17.0)
MCH: 28.5 pg (ref 26.0–34.0)
MCHC: 33.3 g/dL (ref 30.0–36.0)
MCV: 85.4 fL (ref 80.0–100.0)
Platelets: 263 10*3/uL (ref 150–400)
RBC: 5.13 MIL/uL (ref 4.22–5.81)
RDW: 12.2 % (ref 11.5–15.5)
WBC: 9.1 10*3/uL (ref 4.0–10.5)
nRBC: 0 % (ref 0.0–0.2)

## 2023-03-21 LAB — TROPONIN I (HIGH SENSITIVITY)
Troponin I (High Sensitivity): 3 ng/L (ref <18)
Troponin I (High Sensitivity): 4 ng/L (ref <18)

## 2023-03-21 LAB — HEPATIC FUNCTION PANEL
ALT: 35 U/L (ref 0–44)
AST: 24 U/L (ref 15–41)
Albumin: 4.4 g/dL (ref 3.5–5.0)
Alkaline Phosphatase: 51 U/L (ref 38–126)
Bilirubin, Direct: 0.2 mg/dL (ref 0.0–0.2)
Indirect Bilirubin: 1.3 mg/dL — ABNORMAL HIGH (ref 0.3–0.9)
Total Bilirubin: 1.5 mg/dL — ABNORMAL HIGH (ref 0.3–1.2)
Total Protein: 7.6 g/dL (ref 6.5–8.1)

## 2023-03-21 LAB — LIPASE, BLOOD: Lipase: 36 U/L (ref 11–51)

## 2023-03-21 MED ORDER — LIDOCAINE VISCOUS HCL 2 % MT SOLN
15.0000 mL | Freq: Once | OROMUCOSAL | Status: AC
  Administered 2023-03-21: 15 mL via OROMUCOSAL
  Filled 2023-03-21: qty 15

## 2023-03-21 MED ORDER — PANTOPRAZOLE SODIUM 40 MG PO TBEC: 40.0000 mg | DELAYED_RELEASE_TABLET | Freq: Every day | ORAL | 1 refills | Status: AC

## 2023-03-21 MED ORDER — PANTOPRAZOLE SODIUM 40 MG PO TBEC
40.0000 mg | DELAYED_RELEASE_TABLET | Freq: Once | ORAL | Status: AC
  Administered 2023-03-21: 40 mg via ORAL
  Filled 2023-03-21: qty 1

## 2023-03-21 MED ORDER — ACETAMINOPHEN 500 MG PO TABS
1000.0000 mg | ORAL_TABLET | Freq: Once | ORAL | Status: AC
  Administered 2023-03-21: 1000 mg via ORAL
  Filled 2023-03-21: qty 2

## 2023-03-21 MED ORDER — ONDANSETRON 4 MG PO TBDP: 4.0000 mg | ORAL_TABLET | Freq: Four times a day (QID) | ORAL | 0 refills | Status: AC | PRN

## 2023-03-21 MED ORDER — ALUM & MAG HYDROXIDE-SIMETH 200-200-20 MG/5ML PO SUSP
30.0000 mL | Freq: Once | ORAL | Status: AC
  Administered 2023-03-21: 30 mL via ORAL
  Filled 2023-03-21: qty 30

## 2023-03-21 NOTE — ED Provider Notes (Signed)
[facility] Provider Note    Event Date/Time   First MD Initiated Contact with Patient 03/21/23 0408     (approximate)   History   Hypertension   HPI  Travis Bolton is a 52 y.o. male with history of hypertension on losartan 50 mg daily who presents to the emergency department with concerns for not feeling well for the past few days.  States that he has had mid chest soreness that has caused him to be more anxious than normal which is caused him not to be able to sleep.  He states he is not sure if it is heartburn.  No shortness of breath, nausea or vomiting, fever, cough, lower extremity swelling or pain.  Was worried because his blood pressure has been elevated so he took an extra half dose of losartan at home tonight.  Reports compliance with his medication.   History provided by patient.    Past Medical History:  Diagnosis Date   Hypertension    Thyroid disease     Past Surgical History:  Procedure Laterality Date   ANKLE FRACTURE SURGERY Right    COLONOSCOPY N/A 09/10/2022   Procedure: COLONOSCOPY;  Surgeon: Jaynie Collins, DO;  Location: [facility] ENDOSCOPY;  Service: Gastroenterology;  Laterality: N/A;   THYROID SURGERY      MEDICATIONS:  Prior to Admission medications   Medication Sig Start Date End Date Taking? Authorizing Provider  losartan (COZAAR) 50 MG tablet Take 50 mg by mouth daily.    [provider]  naproxen (NAPROSYN) 500 MG tablet Take 1 tablet (500 mg total) by mouth 2 (two) times daily with a meal. Patient not taking: Reported on 09/10/2022 11/20/15   Lutricia Feil, PA-C    Physical Exam   Triage Vital Signs: ED Triage Vitals  Encounter Vitals Group     BP 03/21/23 0325 (!) 162/97     Systolic BP Percentile --      Diastolic BP Percentile --      Pulse Rate 03/21/23 0325 80     Resp 03/21/23 0325 18     Temp 03/21/23 0325 98.4 F (36.9 C)     Temp Source 03/21/23 0325 Oral     SpO2 03/21/23 0325 98 %      Weight --      Height --      Head Circumference --      Peak Flow --      Pain Score 03/21/23 0315 4     Pain Loc --      Pain Education --      Exclude from Growth Chart --     Most recent vital signs: Vitals:   03/21/23 0515 03/21/23 0530  BP:  (!) 146/78  Pulse: 67 74  Resp: 16   Temp:    SpO2: 98% 97%    CONSTITUTIONAL: Alert, responds appropriately to questions. Well-appearing; well-nourished HEAD: Normocephalic, atraumatic EYES: Conjunctivae clear, pupils appear equal, sclera nonicteric ENT: normal nose; moist mucous membranes NECK: Supple, normal ROM CARD: RRR; S1 and S2 appreciated RESP: Normal chest excursion without splinting or tachypnea; breath sounds clear and equal bilaterally; no wheezes, no rhonchi, no rales, no hypoxia or respiratory distress, speaking full sentences ABD/GI: Non-distended; soft, non-tender, no rebound, no guarding, no peritoneal signs BACK: The back appears normal EXT: Normal ROM in all joints; no deformity noted, no edema SKIN: Normal color for age and race; warm; no rash on exposed skin NEURO: Moves all extremities equally,  normal speech PSYCH: The patient's mood and manner are appropriate.   ED Results / Procedures / Treatments   LABS: (all labs ordered are listed, but only abnormal results are displayed) Labs Reviewed  BASIC METABOLIC PANEL - Abnormal; Notable for the following components:      Result Value   Glucose, Bld 119 (*)    All other components within normal limits  HEPATIC FUNCTION PANEL - Abnormal; Notable for the following components:   Total Bilirubin 1.5 (*)    Indirect Bilirubin 1.3 (*)    All other components within normal limits  CBC  LIPASE, BLOOD  TROPONIN I (HIGH SENSITIVITY)  TROPONIN I (HIGH SENSITIVITY)     EKG:  EKG Interpretation Date/Time:  Monday March 21 2023 03:21:17 EDT Ventricular Rate:  80 PR Interval:  150 QRS Duration:  86 QT Interval:  372 QTC Calculation: 429 R  Axis:   4  Text Interpretation: Normal sinus rhythm with sinus arrhythmia Cannot rule out Anterior infarct , age undetermined Abnormal ECG When compared with ECG of 20-Nov-2008 06:22, No significant change was found Confirmed by Rochele Raring [id number]) on 03/21/2023 4:00:55 AM         RADIOLOGY: My personal review and interpretation of imaging: Chest x-ray clear.  I have personally reviewed all radiology reports.   DG Chest 2 View  Result Date: 03/21/2023 CLINICAL DATA:  Chest soreness. Hypertension with substernal soreness in the chest. Elevated blood pressure. EXAM: CHEST - 2 VIEW COMPARISON:  Left ribs 03/13/2007 FINDINGS: Heart size and pulmonary vascularity are normal. Lungs are clear. No pleural effusions. No pneumothorax. Mediastinal contours appear intact. Old appearing left rib fractures. Degenerative changes in the spine. IMPRESSION: No active cardiopulmonary disease. Electronically Signed   By: Burman Nieves M.D.   On: 03/21/2023 03:54     PROCEDURES:  Critical Care performed: No   CRITICAL CARE Performed by: Baxter Hire Annakate Soulier   Total critical care time: 0 minutes  Critical care time was exclusive of separately billable procedures and treating other patients.  Critical care was necessary to treat or prevent imminent or life-threatening deterioration.  Critical care was time spent personally by me on the following activities: development of treatment plan with patient and/or surrogate as well as nursing, discussions with consultants, evaluation of patient's response to treatment, examination of patient, obtaining history from patient or surrogate, ordering and performing treatments and interventions, ordering and review of laboratory studies, ordering and review of radiographic studies, pulse oximetry and re-evaluation of patient's condition.   Marland Kitchen1-3 Lead EKG Interpretation  Performed by: Tamera Pingley, Layla Maw, DO Authorized by: Kyandre Okray, Layla Maw, DO     Interpretation: normal      ECG rate:  80   ECG rate assessment: normal     Rhythm: sinus rhythm     Ectopy: none     Conduction: normal       IMPRESSION / MDM / ASSESSMENT AND PLAN / ED COURSE  I reviewed the triage vital signs and the nursing notes.    Patient here with chest pain, not feeling well, insomnia.  The patient is on the cardiac monitor to evaluate for evidence of arrhythmia and/or significant heart rate changes.   DIFFERENTIAL DIAGNOSIS (includes but not limited to):   ACS, PE, dissection, hypertension, pneumonia, CHF, GERD, esophagitis, esophageal spasm   Patient's presentation is most consistent with acute presentation with potential threat to life or bodily function.   PLAN: EKG nonischemic.  First troponin negative.  Will obtain second troponin.  Normal  electrolytes, renal function.  Will obtain LFTs, lipase.  Chest x-ray reviewed and interpreted by myself and radiologist and shows no acute abnormality.  Will give GI cocktail, Tylenol and reassess.   MEDICATIONS GIVEN IN ED: Medications  alum & mag hydroxide-simeth (MAALOX/MYLANTA) 200-200-20 MG/5ML suspension 30 mL (30 mLs Oral Given 03/21/23 0437)  lidocaine (XYLOCAINE) 2 % viscous mouth solution 15 mL (15 mLs Mouth/Throat Given 03/21/23 0437)  pantoprazole (PROTONIX) EC tablet 40 mg (40 mg Oral Given 03/21/23 0437)  acetaminophen (TYLENOL) tablet 1,000 mg (1,000 mg Oral Given 03/21/23 0437)     ED COURSE: Blood pressures currently in the 140s/70s.  Second troponin negative.  He reports feeling better.  He is safe for discharge with close PCP follow-up as an outpatient.  Will discharge with Protonix, Zofran.  Recommended diet changes and over-the-counter medications that can be used for indigestion, GERD.  Patient is comfortable with this plan.   At this time, I do not feel there is any life-threatening condition present. I reviewed all nursing notes, vitals, pertinent previous records.  All lab and urine results, EKGs, imaging ordered  have been independently reviewed and interpreted by myself.  I reviewed all available radiology reports from any imaging ordered this visit.  Based on my assessment, I feel the patient is safe to be discharged home without further emergent workup and can continue workup as an outpatient as needed. Discussed all findings, treatment plan as well as usual and customary return precautions.  They verbalize understanding and are comfortable with this plan.  Outpatient follow-up has been provided as needed.  All questions have been answered.    CONSULTS:  none   OUTSIDE RECORDS REVIEWED: Reviewed last family medicine note on 02/10/2023.       FINAL CLINICAL IMPRESSION(S) / ED DIAGNOSES   Final diagnoses:  Atypical chest pain  Hypertension, unspecified type     Rx / DC Orders   ED Discharge Orders          Ordered    pantoprazole (PROTONIX) 40 MG tablet  Daily        03/21/23 0541    ondansetron (ZOFRAN-ODT) 4 MG disintegrating tablet  Every 6 hours PRN        03/21/23 0541             Note:  This document was prepared using Dragon voice recognition software and may include unintentional dictation errors.   Shikha Bibb, Layla Maw, DO 03/21/23 [phone]

## 2023-03-21 NOTE — ED Triage Notes (Signed)
Pt to ED via POV with steady gait. Pt c/o HTN and substernal soreness to his chest, reports last BP at home 183/107, noted hx of HTN. Pt states symptoms have been ongoing x 3 days. Pt states has been having some insomnia as well. Pt states he was notified that his watch alerted him that he was in A-fib. Pt also c/o feeling burning/gas substernally. Pt A&O x4, ambulatory with steady gait at this time.

## 2023-03-21 NOTE — Discharge Instructions (Addendum)
Your lab work, chest x-ray, EKG were reassuring today and your blood pressure has improved.  I recommend close follow-up with your primary care doctor.  Please avoid NSAIDs such as aspirin (Goody powders), ibuprofen (Motrin, Advil), naproxen (Aleve) as these may worsen your symptoms.  Tylenol 1000 mg every 6 hours is safe to take as long as you have no history of liver problems (heavy alcohol use, cirrhosis, hepatitis).  Please avoid spicy, acidic (citrus fruits, tomato based sauces, salsa), greasy, fatty foods.  Please avoid caffeine and alcohol.  Smoking can also make GERD/acid reflux worse.  Over the counter medications such as TUMS, Maalox or Mylanta, pepcid, Prilosec or Nexium may help with your symptoms.  Do not take Prilosec or Nexium if you are already prescribed a proton pump inhibitor.
